# Patient Record
Sex: Male | Born: 1965 | Race: White | Hispanic: No | Smoking: Never smoker
Health system: Southern US, Community
[De-identification: ages and names within clinical notes are randomized; demographics above are authoritative.]

## PROBLEM LIST (undated history)

## (undated) DIAGNOSIS — E119 Type 2 diabetes mellitus without complications: Secondary | ICD-10-CM

## (undated) DIAGNOSIS — I10 Essential (primary) hypertension: Secondary | ICD-10-CM

---

## 2006-04-12 ENCOUNTER — Emergency Department (HOSPITAL_COMMUNITY): Admission: EM | Admit: 2006-04-12 | Discharge: 2006-04-12 | Payer: Self-pay | Admitting: Emergency Medicine
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
## 2018-09-22 ENCOUNTER — Other Ambulatory Visit: Payer: Self-pay

## 2018-09-22 ENCOUNTER — Emergency Department (HOSPITAL_BASED_OUTPATIENT_CLINIC_OR_DEPARTMENT_OTHER)
Admission: EM | Admit: 2018-09-22 | Discharge: 2018-09-22 | Disposition: A | Discharge status: Home / Self Care | Payer: Self-pay | Attending: Emergency Medicine | Admitting: Emergency Medicine

## 2018-09-22 ENCOUNTER — Encounter (HOSPITAL_BASED_OUTPATIENT_CLINIC_OR_DEPARTMENT_OTHER): Payer: Self-pay | Admitting: *Deleted

## 2018-09-22 DIAGNOSIS — S90424A Blister (nonthermal), right lesser toe(s), initial encounter: Secondary | ICD-10-CM | POA: Insufficient documentation

## 2018-09-22 DIAGNOSIS — Z794 Long term (current) use of insulin: Secondary | ICD-10-CM | POA: Insufficient documentation

## 2018-09-22 DIAGNOSIS — E119 Type 2 diabetes mellitus without complications: Secondary | ICD-10-CM | POA: Insufficient documentation

## 2018-09-22 DIAGNOSIS — I10 Essential (primary) hypertension: Secondary | ICD-10-CM | POA: Insufficient documentation

## 2018-09-22 DIAGNOSIS — X58XXXA Exposure to other specified factors, initial encounter: Secondary | ICD-10-CM | POA: Insufficient documentation

## 2018-09-22 DIAGNOSIS — G6289 Other specified polyneuropathies: Secondary | ICD-10-CM | POA: Insufficient documentation

## 2018-09-22 DIAGNOSIS — Y939 Activity, unspecified: Secondary | ICD-10-CM | POA: Insufficient documentation

## 2018-09-22 DIAGNOSIS — Y999 Unspecified external cause status: Secondary | ICD-10-CM | POA: Insufficient documentation

## 2018-09-22 DIAGNOSIS — Y929 Unspecified place or not applicable: Secondary | ICD-10-CM | POA: Insufficient documentation

## 2018-09-22 DIAGNOSIS — L089 Local infection of the skin and subcutaneous tissue, unspecified: Secondary | ICD-10-CM | POA: Insufficient documentation

## 2018-09-22 HISTORY — DX: Type 2 diabetes mellitus without complications: E11.9

## 2018-09-22 HISTORY — DX: Essential (primary) hypertension: I10

## 2018-09-22 MED ORDER — CEPHALEXIN 500 MG PO CAPS: 500.0000 mg | ORAL_CAPSULE | Freq: Three times a day (TID) | ORAL | 0 refills | Status: AC

## 2018-09-22 MED ORDER — GABAPENTIN 300 MG PO CAPS: 300.0000 mg | ORAL_CAPSULE | Freq: Three times a day (TID) | ORAL | 0 refills | Status: AC

## 2018-09-22 NOTE — ED Notes (Signed)
Educated on [facility].

## 2018-09-22 NOTE — ED Triage Notes (Signed)
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

## 2018-09-24 NOTE — ED Provider Notes (Signed)
[facility] EMERGENCY DEPARTMENT Provider Note   CSN: [id number] Arrival date & time: 09/22/18  0901     History   Chief Complaint Chief Complaint  Patient presents with  . Toe Pain    HPI Wayne Graham is a 53 y.o. male.     HPI   52yM w/ r middle toe pain. Some bleeding and swelling began two days ago. 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 He has since moved out of the freezer but the tingling has continued. He is a diabetic.   Past Medical History:  Diagnosis Date  . Diabetes mellitus without complication (HCC)   . Hypertension     There are no active problems to display for this patient.     Home Medications    Prior to Admission medications   Medication Sig Start Date End Date Taking? Authorizing Provider  insulin aspart (NOVOLOG) 100 UNIT/ML injection Inject into the skin 3 (three) times daily before meals.   Yes [provider]  insulin NPH-regular Human (70-30) 100 UNIT/ML injection Inject into the skin.   Yes [provider]  cephALEXin (KEFLEX) 500 MG capsule Take 1 capsule (500 mg total) by mouth 3 (three) times daily. 09/22/18   Raeford RazorKohut, Janalyn Higby, MD  gabapentin (NEURONTIN) 300 MG capsule Take 1 capsule (300 mg total) by mouth 3 (three) times daily. 09/22/18   Raeford RazorKohut, Ronson Hagins, MD    Family History No family history on file.  Social History Social History   Tobacco Use  . Smoking status: Never Smoker  . Smokeless tobacco: Never Used  Substance Use Topics  . Alcohol use: Yes  . Drug use: Never     Allergies   Patient has no known allergies.   Review of Systems Review of Systems  All systems reviewed and negative, other than as noted in HPI.  Physical Exam Updated Vital Signs BP (!) 170/103 (BP Location: Right Arm)   Pulse 76   Temp 97.8 F (36.6 C) (Oral)   Resp 18   Ht 6\' 1"  (1.854 m)   Wt 115.7 kg   SpO2 97%   BMI 33.64 kg/m   Physical Exam  Vitals signs and nursing note reviewed.  Constitutional:      General: He is not in acute distress.    Appearance: He is well-developed.  HENT:     Head: Normocephalic and atraumatic.  Eyes:     General:        Right eye: No discharge.        Left eye: No discharge.     Conjunctiva/sclera: Conjunctivae normal.  Neck:     Musculoskeletal: Neck supple.  Cardiovascular:     Rate and Rhythm: Normal rate and regular rhythm.     Heart sounds: Normal heart sounds. No murmur. No friction rub. No gallop.   Pulmonary:     Effort: Pulmonary effort is normal. No respiratory distress.     Breath sounds: Normal breath sounds.  Abdominal:     General: There is no distension.     Palpations: Abdomen is soft.     Tenderness: There is no abdominal tenderness.  Musculoskeletal:        General: No tenderness.     Comments: oncomycosis of R middle toe. Small blood filled blister over dorsum of distal toe. Small amount of dried blood around cuticle. Perhaps some mild swelling.   Skin:    General: Skin is warm and dry.  Neurological:     Mental Status: He is alert.  Psychiatric:        Behavior: Behavior normal.        Thought Content: Thought content normal.      ED Treatments / Results  Labs (all labs ordered are listed, but only abnormal results are displayed) Labs Reviewed - No data to display  EKG None  Radiology No results found.  Procedures Procedures (including critical care time)  Medications Ordered in ED Medications - No data to display   Initial Impression / Assessment and Plan / ED Course  I have reviewed the triage vital signs and the nursing notes.  Pertinent labs & imaging results that were available during my care of the patient were reviewed by me and considered in my medical decision making (see chart for details).        52yM with small blood blister to toe. Perhaps some mild swelling. Since her is a diabetic, will place on abx. 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   Final Clinical Impressions(s) / ED Diagnoses   Final diagnoses:  Other polyneuropathy  Blister of toe of right foot with infection, initial encounter    ED Discharge Orders         Ordered    cephALEXin (KEFLEX) 500 MG capsule  3 times daily     09/22/18 0946    gabapentin (NEURONTIN) 300 MG capsule  3 times daily     09/22/18 0946           Virgel Manifold, MD 09/24/18 1240
